# Patient Record
Sex: Male | Born: 1955 | Race: Black or African American | Hispanic: No | Marital: Single | Smoking: Current every day smoker
Health system: Southern US, Community
[De-identification: ages and names within clinical notes are randomized; demographics above are authoritative.]

## PROBLEM LIST (undated history)

## (undated) DIAGNOSIS — F329 Major depressive disorder, single episode, unspecified: Secondary | ICD-10-CM

## (undated) DIAGNOSIS — I10 Essential (primary) hypertension: Secondary | ICD-10-CM

## (undated) DIAGNOSIS — F4310 Post-traumatic stress disorder, unspecified: Secondary | ICD-10-CM

## (undated) DIAGNOSIS — R001 Bradycardia, unspecified: Secondary | ICD-10-CM

## (undated) DIAGNOSIS — G43909 Migraine, unspecified, not intractable, without status migrainosus: Secondary | ICD-10-CM

## (undated) DIAGNOSIS — M1990 Unspecified osteoarthritis, unspecified site: Secondary | ICD-10-CM

## (undated) DIAGNOSIS — F32A Depression, unspecified: Secondary | ICD-10-CM

## (undated) HISTORY — PX: TOTAL HIP ARTHROPLASTY: SHX124

## (undated) HISTORY — PX: JOINT REPLACEMENT: SHX530

---

## 2007-05-09 ENCOUNTER — Inpatient Hospital Stay (HOSPITAL_COMMUNITY): Admission: RE | Admit: 2007-05-09 | Discharge: 2007-05-13 | Payer: Self-pay | Admitting: Orthopedic Surgery

## 2008-10-28 ENCOUNTER — Other Ambulatory Visit: Payer: Self-pay | Admitting: Emergency Medicine

## 2008-10-29 ENCOUNTER — Ambulatory Visit: Payer: Self-pay | Admitting: Psychiatry

## 2008-10-29 ENCOUNTER — Inpatient Hospital Stay (HOSPITAL_COMMUNITY): Admission: AD | Admit: 2008-10-29 | Discharge: 2008-11-02 | Payer: Self-pay | Admitting: Psychiatry

## 2010-07-02 LAB — RAPID URINE DRUG SCREEN, HOSP PERFORMED
Amphetamines: NOT DETECTED
Barbiturates: NOT DETECTED
Benzodiazepines: NOT DETECTED
Cocaine: POSITIVE — AB
Opiates: NOT DETECTED

## 2010-07-02 LAB — URINALYSIS, ROUTINE W REFLEX MICROSCOPIC
Glucose, UA: NEGATIVE mg/dL
Hgb urine dipstick: NEGATIVE
Protein, ur: NEGATIVE mg/dL
Specific Gravity, Urine: 1.005 (ref 1.005–1.030)
pH: 6 (ref 5.0–8.0)

## 2010-07-02 LAB — DIFFERENTIAL
Eosinophils Relative: 1 % (ref 0–5)
Lymphocytes Relative: 17 % (ref 12–46)
Lymphs Abs: 1.5 10*3/uL (ref 0.7–4.0)
Monocytes Absolute: 0.8 10*3/uL (ref 0.1–1.0)
Neutro Abs: 6.3 10*3/uL (ref 1.7–7.7)

## 2010-07-02 LAB — COMPREHENSIVE METABOLIC PANEL
AST: 31 U/L (ref 0–37)
Albumin: 4.5 g/dL (ref 3.5–5.2)
BUN: 6 mg/dL (ref 6–23)
Calcium: 9.4 mg/dL (ref 8.4–10.5)
Creatinine, Ser: 0.94 mg/dL (ref 0.4–1.5)
GFR calc Af Amer: >60 mL/min (ref >60)

## 2010-07-02 LAB — CBC
MCHC: 32.8 g/dL (ref 30.0–36.0)
MCV: 90.3 fL (ref 78.0–100.0)
Platelets: 251 10*3/uL (ref 150–400)
RDW: 14.2 % (ref 11.5–15.5)
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
## 2010-08-09 NOTE — Op Note (Signed)
NAME:  Barry Jordan, Barry Jordan               ACCOUNT NO.:  [id number]   MEDICAL RECORD NO.:  [id number]          PATIENT TYPE:  INP   LOCATION:  0003                         FACILITY:  [facility]   PHYSICIAN:  John L. Rendall, M.D.  DATE OF BIRTH:  03-Nov-1955   DATE OF PROCEDURE:  05/09/2007  DATE OF DISCHARGE:                               OPERATIVE REPORT   PREOPERATIVE DIAGNOSIS:  End-stage osteoarthritis left hip.   PROCEDURE:  Left AML total hip replacement.   POSTOPERATIVE DIAGNOSIS:  End-stage osteoarthritis left hip.   SURGEON:  John L. Rendall, M.D.   ASSISTANT:  Legrand Pitts. Duffy, P.A.   ANESTHESIA:  General.   PATHOLOGY:  The patient has bone on bone left hip with actual  subluxation of the hip ball out of the lateral acetabulum.  There is  great deformity of the femoral head, some shortening of the leg.  The  hip ball is noted located in the center of the socket and the acetabulum  is oval in shape.   DESCRIPTION OF PROCEDURE:  Under general anesthesia, the patient was  placed in the right lateral decubitus position.  The hip was prepared  with DuraPrep and draped as a sterile field.  He received 2 grams of  Ancef preoperatively.  A posterior approach is made splitting the IT  band in the line of its fibers, inserting a short Charnley retractor.  The short external rotators and hip capsule are then released from bone  with electrocautery as the hip is internally rotated.  The hip is found  to be full of florid synovitis which is resected and multiple small  vessels are cauterized upon opening the capsule.  The hip capsule is  opened with an L-shaped incision using the electrocautery.  The hip is  then dislocated.  The superior femoral neck is exposed with a Mueller  proximally and a Catering manager.  The superior femoral neck is exposed.  An IM initiator, canal finder, and reamers were used.  Progressive  reaming up to 16 mm is done.  The femoral neck is then osteotomized and  a  cookie cutter is used to remove bone from the calcar region.  Rasping  is then done 12 mm large, 13.5 large, 15 large, and 16.5 large.  Calcar  reamer is used and excellent fit, alignment, and stability are obtained  with this sizing.  Attention is then turned to the acetabulum.  The  acetabulum has very abnormal bone around its margin with significant  spurring and synovitis.  This is debrided.  Two ring retractors are  placed superiorly and two Cobras inferiorly.  Reaming is then done with  the 43, 45, 47, 49, 51, 53, 55, 57, and 59 acetabular reamers.  Final  reaming is done up to 62 and it should be noted a very large posterior  acetabular cyst was found.  This was removed with curet and rongeurs.  It was subsequently packed with bone and the 62 reamer gave fairly good  round fit in the acetabulum.  The Pinnacle 300 series acetabulum size 64  was then inserted and it  bottomed out nicely.  It was a good choice as  there was just slight area of the cyst being soft at the bottom and one  slight area at the top, but 75-80% of all of this was well seated and  well covered.  At this point, trial reduction of the 16.5 large rasp  with 36 hip ball and a +4 acetabulum was then done.  This revealed  excellent fit, alignment, and stability.  The -2 neck length was the  best size.  At this point, permanent components were placed in the  acetabulum, an apex hole eliminator, and +4 10 degree lip 36 poly was  inserted into the acetabulum.  At this point, with the acetabulum  complete, the 16.5 AML stem was inserted and the 36 hip ball with the -2  neck length.  This, we felt, left the leg perhaps 3-4 mm long, but this  was the shortest neck length and it was felt to be the appropriate thing  to do.  At this point, the hip was reduced.  The hip capsule was  repaired with #1 Tycron.  Hemoglobin was tested and found to be 8.4.  Blood was started two units, giving him one unit in the operating room  and  the second unit was to be given upon arrival in recovery.  He was  closed in layers with #1 Tycron, 0 Vicryl, 2-0 Vicryl, and skin clips.  Operative time was 1 hour 30 minutes.   COMPLICATIONS:  Oval acetabulum and gross synovitis, but no  complications occurred in the case.  These were just difficulty factors  which were overcome.  The patient returned to the recovery room in good  condition.      John L. Rendall, M.D.  Electronically Signed     JLR/MEDQ  D:  05/09/2007  T:  05/10/2007  Job:  [id number]
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
## 2010-08-12 NOTE — Discharge Summary (Signed)
NAME:  Barry Jordan, BERTONE               ACCOUNT NO.:  [id number]   MEDICAL RECORD NO.:  [id number]          PATIENT TYPE:  INP   LOCATION:  1605                         FACILITY:  [facility]   PHYSICIAN:  John L. Rendall, M.D.  DATE OF BIRTH:  18-Dec-1955   DATE OF ADMISSION:  05/09/2007  DATE OF DISCHARGE:  05/13/2007                               DISCHARGE SUMMARY   ADMISSION DIAGNOSES:  1. End-stage osteoarthritis left hip.  2. Hypertension.  3. Tobacco abuse.  4. History of drug abuse, sober 5 years.   DISCHARGE DIAGNOSES:  1. End-stage osteoarthritis left hip status post left total hip      arthroplasty.  2. Acute blood loss anemia secondary to surgery.  3. Hypokalemia, now resolved.  4. Hematoma left hip.  5. Pyrexia.  6. Constipation, now resolved.  7. Hypertension.  8. Tobacco abuse.  9. History of drug use, sober for 5 years.   SURGICAL PROCEDURES:  On May 09, 2007, Barry Jordan underwent a left  total hip arthroplasty by Dr. Jonny Ruiz L.  Rendall assisted by Arnoldo Morale,  PA-C.  He had a pinnacle 300 series acetabular cup size 64 mm placed  with a Pinnacle Marathon acetabular liner +4 10 degree liner, 36 mm  inner diameter, 64 mm outer diameter.  An apex hole eliminator.  An AML  large stature 160 mm length, 45 mm offset, size 16.5 femoral stem with  an Articulese metal-on-metal femoral head 36 mm, -2 neck length, 12-14  taper.   COMPLICATIONS:  None.   CONSULTATIONS:  1. Case management and physical therapy consult May 10, 2007.  2. Occupational therapy consult May 11, 2007.   HISTORY OF PRESENT ILLNESS:  This 55 year old black male patient  presented to Dr. Priscille Kluver with history of a left hip pain for the last 8  years.  It came on gradually and has been getting progressively worse.  It is an intermittent sharp, stabbing sensation over the latter  trochanter and groin with radiation to the knee.  It increases with  prolonged standing and decreases with  elevation, tramadol and Vicodin.  The hip catches, grinds, locks, spasms, gives way and hurts with  sleeping on it.  He has failed conservative treatment and because of  that he is presenting for a left hip replacement.   HOSPITAL COURSE:  Barry Jordan tolerated his surgical procedure well  without immediate postoperative complications.  He was transferred to  the orthopedic floor.  On postop day #1, T-max was 101.2, vitals were  stable.  Hemoglobin was 8.1 with hematocrit of 23.1.  He was transfused  with 2 units of packed red blood cells.  Potassium was low at 3.3 and  that was supplemented orally.  He was started on therapy per protocol.   Postop day #2, T-max 101.6, vitals stable.  Hemoglobin had improved to  8.5 with hematocrit of 24.1.  He was started on iron.  Hip dressing was  intact with a moderate amount of serosanguineous drainage.  Aggressive  pulmonary toilet was done.  Temperature was monitored.  He was switched  to p.o. pain meds.  Postop day #3, hemoglobin had dropped to 7.3 with hematocrit of 21.  BP  was 117/62.  He has moderate left thigh edema it was felt he had  developed a hematoma in that thigh.  He was transfused with 2 units of  packed red blood cells and his Arixtra was felt that day.  Stools were  to be guaiaced, which were all negative.   On postop day #4, he is feeling better.  Hemoglobin was 8.9, hematocrit  25.7, vitals stable, moderate edema in the left thigh but he was doing  well with therapy.  Pain was well-controlled and it was felt he was  ready for discharge home and was discharged home later that day.   DISCHARGE INSTRUCTIONS:   DIET:  He can resume his regular prehospitalization diet.   MEDICATIONS:  He may resume his home meds as follows.  1. Amlodipine 10 mg p.o. q.a.m.  2. Hydrochlorothiazide 25 mg p.o. q.a.m.  3. He is to hold his Levitra, tramadol and ibuprofen at this time.   Additional meds at this time include  1. Arixtra 2.5 mg  subcu at 8 p.m. last dose on May 18, 2007.  On      the 22nd, he is to start a baby aspirin a day for 1 month.  2. Celebrex 200 mg 1 tablet p.o. b.i.d.  3. Lyrica 50 mg 1 tablet p.o. b.i.d. for 1 week, 14 with no refill.  4. OxyContin 10 mg 1 tablet p.o. q.12h., 24 with no refill.  5. Percocet 5/325 one to two tablets p.o. q.4h. p.r.n. for pain, 60      with no refill.  6. Robaxin 500 mg 1-2 tablets p.o. q.6h p.r.n. for spasms, 50 with no      refill.  7. He is to take iron supplement twice a day for 1 month.   ACTIVITY:  He can be out of bed weightbearing as tolerated on the left  leg with use of a walker.  No lifting or driving for 6 weeks and he  needs to increase activity slowly.  Please see the white total hip  discharge sheet for further activity instructions.   FOLLOW-UP:  He is to follow up with Dr. Priscille Kluver in our office on  Tuesday, May 21, 2007, or Thursday, May 23, 2007, and needs to  call [phone] for that appointment.   WOUND CARE:  He is to keep the incision clean and dry and may shower  after no drainage from the wound for two days.  Please see the white  total hip discharge sheet for further wound care instructions.   LABORATORY DATA:  Hemoglobin and hematocrit ranged from 12.3 and 35.6 on  the 10th, to 7.3 and 21 on the 15th, to 8.9 and 25.7 on the 16th.  White  count remained within normal limits.  Platelets went from 232 on the  10th, to 95 on the 15th, to 139 on the 16th.   Potassium ranged from 3.5 on the 10th, to 3.1 on the 12th, to 3.9 on the  14th.  CO2 ranged from 33 on the 10th, to 29 on the 14th.  Glucose  ranged from 88 on the 10th, to 139 on the 14th.  All other laboratory  studies were within normal limits.      Legrand Pitts Duffy, P.A.      John L. Rendall, M.D.  Electronically Signed    KED/MEDQ  D:  05/28/2007  T:  05/28/2007  Job:  [id number]

## 2010-12-16 LAB — CBC
HCT: 35.6 — ABNORMAL LOW
HCT: 21 — ABNORMAL LOW
Hemoglobin: 12.3 — ABNORMAL LOW
Hemoglobin: 7.3 — CL
Hemoglobin: 8.5 — ABNORMAL LOW
Hemoglobin: 8.9 — ABNORMAL LOW
MCHC: 34.8
MCHC: 35
MCV: 91.6
Platelets: 111 — ABNORMAL LOW
Platelets: 232
RBC: 2.31 — ABNORMAL LOW
RBC: 2.64 — ABNORMAL LOW
RBC: 2.91 — ABNORMAL LOW
RDW: 13.1
RDW: 13.3
RDW: 13.2
WBC: 7.4
WBC: 8.8

## 2010-12-16 LAB — COMPREHENSIVE METABOLIC PANEL
Albumin: 4.2
Alkaline Phosphatase: 97
BUN: 21
Chloride: 101
Creatinine, Ser: 0.94
GFR calc non Af Amer: >60
Glucose, Bld: 88
Potassium: 3.5
Total Bilirubin: 0.4

## 2010-12-16 LAB — URINE CULTURE: Culture: NO GROWTH

## 2010-12-16 LAB — RAPID URINE DRUG SCREEN, HOSP PERFORMED
Amphetamines: NOT DETECTED
Benzodiazepines: NOT DETECTED
Cocaine: NOT DETECTED
Opiates: NOT DETECTED
Tetrahydrocannabinol: NOT DETECTED

## 2010-12-16 LAB — CROSSMATCH: Antibody Screen: NEGATIVE

## 2010-12-16 LAB — POCT I-STAT 4, (NA,K, GLUC, HGB,HCT)
Glucose, Bld: 130 — ABNORMAL HIGH
HCT: 24 — ABNORMAL LOW
Hemoglobin: 8.2 — ABNORMAL LOW
Operator id: 242871
Sodium: 138

## 2010-12-16 LAB — BASIC METABOLIC PANEL
BUN: 10
Calcium: 8.2 — ABNORMAL LOW
Calcium: 8.4
Creatinine, Ser: 0.93
GFR calc Af Amer: >60
GFR calc non Af Amer: >60
GFR calc non Af Amer: >60
Glucose, Bld: 117 — ABNORMAL HIGH
Glucose, Bld: 139 — ABNORMAL HIGH
Sodium: 141

## 2010-12-16 LAB — URINALYSIS, ROUTINE W REFLEX MICROSCOPIC
Bilirubin Urine: NEGATIVE
Ketones, ur: NEGATIVE
Nitrite: NEGATIVE
Protein, ur: NEGATIVE
pH: 6.5

## 2010-12-16 LAB — PROTIME-INR
INR: 1
Prothrombin Time: 13.2

## 2010-12-16 LAB — DIFFERENTIAL
Basophils Absolute: 0
Basophils Relative: 1
Lymphocytes Relative: 30
Monocytes Absolute: 0.6
Neutro Abs: 4
Neutrophils Relative %: 53

## 2010-12-16 LAB — OCCULT BLOOD X 1 CARD TO LAB, STOOL: Fecal Occult Bld: NEGATIVE

## 2010-12-16 LAB — APTT: aPTT: 30

## 2010-12-16 LAB — PREPARE RBC (CROSSMATCH)

## 2012-01-30 ENCOUNTER — Emergency Department (HOSPITAL_COMMUNITY): Payer: Medicare Other

## 2012-01-30 ENCOUNTER — Encounter (HOSPITAL_COMMUNITY): Payer: Self-pay | Admitting: Emergency Medicine

## 2012-01-30 ENCOUNTER — Emergency Department (HOSPITAL_COMMUNITY)
Admission: EM | Admit: 2012-01-30 | Discharge: 2012-01-30 | Disposition: A | Discharge status: Home / Self Care | Payer: Medicare Other | Attending: Emergency Medicine | Admitting: Emergency Medicine

## 2012-01-30 DIAGNOSIS — Y9289 Other specified places as the place of occurrence of the external cause: Secondary | ICD-10-CM | POA: Insufficient documentation

## 2012-01-30 DIAGNOSIS — S93409A Sprain of unspecified ligament of unspecified ankle, initial encounter: Secondary | ICD-10-CM | POA: Insufficient documentation

## 2012-01-30 DIAGNOSIS — Y939 Activity, unspecified: Secondary | ICD-10-CM | POA: Insufficient documentation

## 2012-01-30 DIAGNOSIS — W1789XA Other fall from one level to another, initial encounter: Secondary | ICD-10-CM | POA: Insufficient documentation

## 2012-01-30 HISTORY — DX: Essential (primary) hypertension: I10

## 2012-01-30 MED ORDER — OXYCODONE-ACETAMINOPHEN 5-325 MG PO TABS: 1.0000 | ORAL_TABLET | ORAL | Status: DC | PRN

## 2012-01-30 MED ORDER — HYDROCODONE-ACETAMINOPHEN 5-325 MG PO TABS
2.0000 | ORAL_TABLET | Freq: Once | ORAL | Status: AC
  Administered 2012-01-30: 2 via ORAL
  Filled 2012-01-30: qty 2

## 2012-01-30 NOTE — ED Notes (Signed)
[id number]<YN> Expected date:<BR> Expected time:<BR> Means of arrival:Ambulance<BR> Comments:<BR> fall

## 2012-01-30 NOTE — ED Notes (Addendum)
Pt here via EMS s/p  Fall on 01/28/12 down a hill with increase pain unable to bear  Pt walks with a cane for for s/p lt hip replacement

## 2012-01-30 NOTE — ED Provider Notes (Signed)
History    76y male with left foot and ankle pain. Patient states that he fell 2 days ago while coming down a Hill. Patient ambulates with a cane at baseline thinks his cane "hit a soft spot and the ground.". Patient does admit to drinking at that time as well though. Persistent foot and ankle pain since then. Difficulty ambulating secondary to the pain. No numbness or tingling. Denies hitting his head. No headache, neck or acute back pain. Pt does says he has chronic back and b/l knee pain. Denies acute change though.   CSN: [id number]  Arrival date & time 01/30/12  1001   First MD Initiated Contact with Patient 01/30/12 1010      Chief Complaint  Patient presents with  . Ankle Pain    (Consider location/radiation/quality/duration/timing/severity/associated sxs/prior treatment) HPI  No past medical history on file.  No past surgical history on file.  No family history on file.  History  Substance Use Topics  . Smoking status: Not on file  . Smokeless tobacco: Not on file  . Alcohol Use: Not on file      Review of Systems   Review of symptoms negative unless otherwise noted in HPI.   Allergies  Review of patient's allergies indicates no known allergies.  Home Medications  No current outpatient prescriptions on file.  BP 140/102  Pulse 89  Temp 98.6 F (37 C) (Oral)  Resp 20  SpO2 99%  Physical Exam  Nursing note and vitals reviewed. Constitutional: He appears well-developed and well-nourished. No distress.  HENT:  Head: Normocephalic and atraumatic.  Eyes: Conjunctivae normal are normal. Right eye exhibits no discharge. Left eye exhibits no discharge.  Neck: Neck supple.  Cardiovascular: Normal rate, regular rhythm and normal heart sounds.  Exam reveals no gallop and no friction rub.   No murmur heard. Pulmonary/Chest: Effort normal and breath sounds normal. No respiratory distress.  Abdominal: Soft. He exhibits no distension. There is no tenderness.    Musculoskeletal: He exhibits no edema and no tenderness.       Diffuse swelling of L foot and ankle. No ecchymosis or concerning skin lesions noted. Tenderness over medial malleolus and distal foot. NVI distally. Both knee in braces. Removed. Normal to inspection. No effusion. No bony tenderness. No midline spinal tenderness.  Neurological: He is alert.  Skin: Skin is warm and dry.  Psychiatric: He has a normal mood and affect. His behavior is normal. Thought content normal.       Not clinically intoxicated.    ED Course  Procedures (including critical care time)  Labs Reviewed - No data to display Dg Ankle Complete Left  01/30/2012  *RADIOLOGY REPORT*  Clinical Data:  ankle pain  LEFT ANKLE COMPLETE - 3+ VIEW  Comparison: None.  Findings: 3  views of the left ankle submitted.  No acute fracture or subluxation.  Ankle mortise is preserved.  Plantar spur of the calcaneus.  IMPRESSION: No acute fracture or subluxation.  Plantar spur of the calcaneus.   Original Report Authenticated By: Natasha Mead, M.D.    Dg Foot Complete Left  01/30/2012  *RADIOLOGY REPORT*  Clinical Data: Fall 4 days ago, pain  LEFT FOOT - COMPLETE 3+ VIEW  Comparison: None  Findings: Osseous demineralization diffusely, moderate. Joint spaces preserved. Minimal hallux valgus. Diffuse soft tissue swelling. No acute fracture, dislocation or bone destruction. Small plantar calcaneal spur.  IMPRESSION: Moderate osseous demineralization. Diffuse soft tissue swelling. No acute bony abnormalities.   Original Report Authenticated By:  Ulyses Southward, M.D.      1. Ankle sprain       MDM  45M with L foot/ankle pain after mechanical fall. Imaging neg for acute osseous injury. NV intact. Plan splint and crutches. RICE. PRN pain meds.        Raeford Razor, MD 01/30/12 [phone]

## 2013-09-22 ENCOUNTER — Emergency Department (HOSPITAL_COMMUNITY)
Admission: EM | Admit: 2013-09-22 | Discharge: 2013-09-23 | Disposition: A | Discharge status: Home / Self Care | Payer: Medicare Other | Attending: Emergency Medicine | Admitting: Emergency Medicine

## 2013-09-22 ENCOUNTER — Encounter (HOSPITAL_COMMUNITY): Payer: Self-pay | Admitting: Emergency Medicine

## 2013-09-22 DIAGNOSIS — F3289 Other specified depressive episodes: Secondary | ICD-10-CM | POA: Insufficient documentation

## 2013-09-22 DIAGNOSIS — F1910 Other psychoactive substance abuse, uncomplicated: Secondary | ICD-10-CM

## 2013-09-22 DIAGNOSIS — F411 Generalized anxiety disorder: Secondary | ICD-10-CM | POA: Insufficient documentation

## 2013-09-22 DIAGNOSIS — R45851 Suicidal ideations: Secondary | ICD-10-CM | POA: Insufficient documentation

## 2013-09-22 DIAGNOSIS — Z87891 Personal history of nicotine dependence: Secondary | ICD-10-CM | POA: Insufficient documentation

## 2013-09-22 DIAGNOSIS — F1010 Alcohol abuse, uncomplicated: Secondary | ICD-10-CM | POA: Diagnosis present

## 2013-09-22 DIAGNOSIS — F1410 Cocaine abuse, uncomplicated: Secondary | ICD-10-CM | POA: Diagnosis present

## 2013-09-22 DIAGNOSIS — I10 Essential (primary) hypertension: Secondary | ICD-10-CM | POA: Insufficient documentation

## 2013-09-22 DIAGNOSIS — G479 Sleep disorder, unspecified: Secondary | ICD-10-CM | POA: Insufficient documentation

## 2013-09-22 DIAGNOSIS — F32A Depression, unspecified: Secondary | ICD-10-CM

## 2013-09-22 DIAGNOSIS — F329 Major depressive disorder, single episode, unspecified: Secondary | ICD-10-CM | POA: Diagnosis present

## 2013-09-22 HISTORY — DX: Major depressive disorder, single episode, unspecified: F32.9

## 2013-09-22 HISTORY — DX: Depression, unspecified: F32.A

## 2013-09-22 HISTORY — DX: Post-traumatic stress disorder, unspecified: F43.10

## 2013-09-22 LAB — CBC
HCT: 42.7 % (ref 39.0–52.0)
HEMOGLOBIN: 14.3 g/dL (ref 13.0–17.0)
MCH: 30.2 pg (ref 26.0–34.0)
MCHC: 33.5 g/dL (ref 30.0–36.0)
MCV: 90.3 fL (ref 78.0–100.0)
Platelets: 251 10*3/uL (ref 150–400)
RBC: 4.73 MIL/uL (ref 4.22–5.81)
RDW: 13.4 % (ref 11.5–15.5)
WBC: 7.4 10*3/uL (ref 4.0–10.5)

## 2013-09-22 LAB — RAPID URINE DRUG SCREEN, HOSP PERFORMED
Amphetamines: NOT DETECTED
Barbiturates: NOT DETECTED
Benzodiazepines: NOT DETECTED
COCAINE: POSITIVE — AB
OPIATES: NOT DETECTED
Tetrahydrocannabinol: NOT DETECTED

## 2013-09-22 LAB — COMPREHENSIVE METABOLIC PANEL
ALT: 23 U/L (ref 0–53)
AST: 30 U/L (ref 0–37)
Albumin: 4.2 g/dL (ref 3.5–5.2)
Alkaline Phosphatase: 90 U/L (ref 39–117)
BUN: 9 mg/dL (ref 6–23)
CALCIUM: 9.5 mg/dL (ref 8.4–10.5)
CO2: 25 meq/L (ref 19–32)
CREATININE: 0.96 mg/dL (ref 0.50–1.35)
Chloride: 101 mEq/L (ref 96–112)
GLUCOSE: 86 mg/dL (ref 70–99)
Potassium: 3.7 mEq/L (ref 3.7–5.3)
SODIUM: 139 meq/L (ref 137–147)
TOTAL PROTEIN: 7.8 g/dL (ref 6.0–8.3)
Total Bilirubin: 0.5 mg/dL (ref 0.3–1.2)

## 2013-09-22 LAB — ACETAMINOPHEN LEVEL: Acetaminophen (Tylenol), Serum: <15 ug/mL (ref 10–30)

## 2013-09-22 LAB — TROPONIN I: Troponin I: <0.3 ng/mL (ref <0.30)

## 2013-09-22 LAB — SALICYLATE LEVEL

## 2013-09-22 LAB — ETHANOL: ALCOHOL ETHYL (B): 162 mg/dL — AB (ref 0–11)

## 2013-09-22 MED ORDER — FOLIC ACID 1 MG PO TABS
1.0000 mg | ORAL_TABLET | Freq: Every day | ORAL | Status: DC
  Administered 2013-09-22 – 2013-09-23 (×2): 1 mg via ORAL
  Filled 2013-09-22 (×2): qty 1

## 2013-09-22 MED ORDER — CHLORDIAZEPOXIDE HCL 25 MG PO CAPS: 25.0000 mg | ORAL_CAPSULE | Freq: Four times a day (QID) | ORAL | Status: DC | PRN

## 2013-09-22 MED ORDER — LORAZEPAM 1 MG PO TABS: 1.0000 mg | ORAL_TABLET | Freq: Four times a day (QID) | ORAL | Status: DC | PRN

## 2013-09-22 MED ORDER — LORAZEPAM 2 MG/ML IJ SOLN
1.0000 mg | Freq: Four times a day (QID) | INTRAMUSCULAR | Status: DC | PRN
Start: 2013-09-22 — End: 2013-09-23

## 2013-09-22 MED ORDER — VITAMIN B-1 100 MG PO TABS
100.0000 mg | ORAL_TABLET | Freq: Every day | ORAL | Status: DC
  Administered 2013-09-22 – 2013-09-23 (×2): 100 mg via ORAL
  Filled 2013-09-22 (×2): qty 1

## 2013-09-22 MED ORDER — LORAZEPAM 1 MG PO TABS: 0.0000 mg | ORAL_TABLET | Freq: Two times a day (BID) | ORAL | Status: DC

## 2013-09-22 MED ORDER — ADULT MULTIVITAMIN W/MINERALS CH
1.0000 | ORAL_TABLET | Freq: Every day | ORAL | Status: DC
  Administered 2013-09-22 – 2013-09-23 (×2): 1 via ORAL
  Filled 2013-09-22 (×2): qty 1

## 2013-09-22 MED ORDER — THIAMINE HCL 100 MG/ML IJ SOLN: 100.0000 mg | Freq: Every day | INTRAMUSCULAR | Status: DC

## 2013-09-22 MED ORDER — LORAZEPAM 1 MG PO TABS: 0.0000 mg | ORAL_TABLET | Freq: Four times a day (QID) | ORAL | Status: DC

## 2013-09-22 NOTE — ED Notes (Signed)
Psychiatry at bedside.

## 2013-09-22 NOTE — ED Notes (Addendum)
Patient has one bag of belongings and a walker. These items are in locker 29.

## 2013-09-22 NOTE — ED Notes (Signed)
Pt belongings in locker #29. 

## 2013-09-22 NOTE — ED Notes (Signed)
Pt states that he uses his walker when his hip "stiffens up" on occasion. Unable to move to psych ed because of this.

## 2013-09-22 NOTE — ED Notes (Signed)
ACT at bedside 

## 2013-09-22 NOTE — ED Notes (Signed)
Bed: [facility] Expected date:  Expected time:  Means of arrival:  Comments: [place]

## 2013-09-22 NOTE — ED Notes (Signed)
Per previous nurse and [employer] pt walking unassisted with no problems in room and in to facility. No walker needed.

## 2013-09-22 NOTE — Progress Notes (Signed)
  CARE MANAGEMENT ED NOTE 09/22/2013  Patient:  Budd PalmerOTTS,Tremar L   Account Number:  [id number]  Date Initiated:  09/22/2013  Documentation initiated by:  Radford PaxFERRERO,AMY  Subjective/Objective Assessment:   Patient presents to ED after [facility].     Subjective/Objective Assessment Detail:     Action/Plan:   Action/Plan Detail:   Anticipated DC Date:       Status Recommendation to Physician:   Result of Recommendation:    Other ED Services  Consult Working Plan    DC Planning Services  Other  PCP issues    Choice offered to / List presented to:            Status of service:  Completed, signed off  ED Comments:   ED Comments Detail:  EDCM spoke with patient at bedisde.  Patient confirms [facility] [place] [address] [place].  System updated.
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
## 2013-09-22 NOTE — Consult Note (Signed)
Barry Jordan Face-to-Face Psychiatry Consult   Reason for Consult:  Depression  Referring Physician:  EDP  Barry Jordan is an 58 y.o. male. Total Time spent with patient: 20 minutes  Assessment: AXIS I:  Alcohol Abuse and Substance Abuse, depression AXIS II:  Deferred AXIS III:   Past Medical History  Diagnosis Date  . Hypertension   . PTSD (post-traumatic stress disorder)   . Depression    AXIS IV:  other psychosocial or environmental problems, problems related to social environment and problems with primary support group AXIS V:  41-50 serious symptoms  Plan:  Supportive therapy provided about ongoing stressors. Re-assess in am, awaiting collateral information from his sister who he allowed Barry Jordan to contact at [phone]  Subjective:   Barry Jordan is a 58 y.o. male patient to stay over night for observation and re-assess in am.  HPI:  Patient states he was frustrated with the [place] because his medication ran out on Thursday and he needed to get more.  He had used cocaine the night before and drank a 40 oz beer this morning.  He got upset but denies he said he was going to shoot himself or anyone else.  Pharell states the police arrived at this house and he agreed to come to the ED, not IVC'd.  Denies suicidal/homicidal ideations and hallucinations.  Drinks 2 - 40 oz beers daily and occasional marijuana use.  He does not want detox as he feels his drinking or cocaine use is a problem.  He has been to detox in the past, last one was in 2010.  Denies suicide attempts or any psychiatric admissions.  When I spoke to his primary RN, she said his sisters came to the ED and feel differently about his present mood state.  They reported he will say all the right things to discharge but really needs help.  Tavaris gave permission to call his sister, Mayzell at [phone] but she was not home, awaiting a returned call. HPI Elements:   Location:  generalized. Quality:  acute. Severity:   moderate. Timing:  intermittent. Duration:  few weeks. Context:  stressors.  Past Psychiatric History: Past Medical History  Diagnosis Date  . Hypertension   . PTSD (post-traumatic stress disorder)   . Depression     reports that he has quit smoking. He does not have any smokeless tobacco history on file. He reports that he drinks about 3.6 ounces of alcohol per week. He reports that he uses illicit drugs (Cocaine). History reviewed. No pertinent family history.         Allergies:  No Known Allergies  ACT Assessment Complete:  Yes:    Educational Status    Risk to Self: Risk to self Is patient at risk for suicide?: No  Risk to Others:    Abuse:    Prior Inpatient Therapy:    Prior Outpatient Therapy:    Additional Information:                    Objective: Blood pressure 128/94, pulse 79, temperature 98.2 F (36.8 C), temperature source Oral, resp. rate 16, SpO2 98.00%.There is no height or weight on file to calculate BMI. Results for orders placed during the Jordan encounter of 09/22/13 (from the past 72 hour(s))  URINE RAPID DRUG SCREEN (HOSP PERFORMED)     Status: Abnormal   Collection Time    09/22/13 11:47 AM      Result Value Ref Range   Opiates NONE DETECTED  NONE DETECTED   Cocaine POSITIVE (*) NONE DETECTED   Benzodiazepines NONE DETECTED  NONE DETECTED   Amphetamines NONE DETECTED  NONE DETECTED   Tetrahydrocannabinol NONE DETECTED  NONE DETECTED   Barbiturates NONE DETECTED  NONE DETECTED   Comment:            DRUG SCREEN FOR MEDICAL PURPOSES     ONLY.  IF CONFIRMATION IS NEEDED     FOR ANY PURPOSE, NOTIFY LAB     WITHIN 5 DAYS.                LOWEST DETECTABLE LIMITS     FOR URINE DRUG SCREEN     Drug Class       Cutoff (ng/mL)     Amphetamine      1000     Barbiturate      200     Benzodiazepine   132     Tricyclics       440     Opiates          300     Cocaine          300     THC              50  ACETAMINOPHEN LEVEL      Status: None   Collection Time    09/22/13 12:00 PM      Result Value Ref Range   Acetaminophen (Tylenol), Serum <15.0  10 - 30 ug/mL   Comment:            THERAPEUTIC CONCENTRATIONS VARY     SIGNIFICANTLY. A RANGE OF 10-30     ug/mL MAY BE AN EFFECTIVE     CONCENTRATION FOR MANY PATIENTS.     HOWEVER, SOME ARE BEST TREATED     AT CONCENTRATIONS OUTSIDE THIS     RANGE.     ACETAMINOPHEN CONCENTRATIONS     >150 ug/mL AT 4 HOURS AFTER     INGESTION AND >50 ug/mL AT 12     HOURS AFTER INGESTION ARE     OFTEN ASSOCIATED WITH TOXIC     REACTIONS.  CBC     Status: None   Collection Time    09/22/13 12:00 PM      Result Value Ref Range   WBC 7.4  4.0 - 10.5 K/uL   RBC 4.73  4.22 - 5.81 MIL/uL   Hemoglobin 14.3  13.0 - 17.0 g/dL   HCT 42.7  39.0 - 52.0 %   MCV 90.3  78.0 - 100.0 fL   MCH 30.2  26.0 - 34.0 pg   MCHC 33.5  30.0 - 36.0 g/dL   RDW 13.4  11.5 - 15.5 %   Platelets 251  150 - 400 K/uL  COMPREHENSIVE METABOLIC PANEL     Status: None   Collection Time    09/22/13 12:00 PM      Result Value Ref Range   Sodium 139  137 - 147 mEq/L   Potassium 3.7  3.7 - 5.3 mEq/L   Chloride 101  96 - 112 mEq/L   CO2 25  19 - 32 mEq/L   Glucose, Bld 86  70 - 99 mg/dL   BUN 9  6 - 23 mg/dL   Creatinine, Ser 0.96  0.50 - 1.35 mg/dL   Calcium 9.5  8.4 - 10.5 mg/dL   Total Protein 7.8  6.0 - 8.3 g/dL   Albumin 4.2  3.5 - 5.2 g/dL  AST 30  0 - 37 U/L   ALT 23  0 - 53 U/L   Alkaline Phosphatase 90  39 - 117 U/L   Total Bilirubin 0.5  0.3 - 1.2 mg/dL   GFR calc non Af Amer >90  >90 mL/min   GFR calc Af Amer >90  >90 mL/min   Comment: (NOTE)     The eGFR has been calculated using the CKD EPI equation.     This calculation has not been validated in all clinical situations.     eGFR's persistently <90 mL/min signify possible Chronic Kidney     Disease.  ETHANOL     Status: Abnormal   Collection Time    09/22/13 12:00 PM      Result Value Ref Range   Alcohol, Ethyl (B) 162 (*) 0 - 11  mg/dL   Comment:            LOWEST DETECTABLE LIMIT FOR     SERUM ALCOHOL IS 11 mg/dL     FOR MEDICAL PURPOSES ONLY  SALICYLATE LEVEL     Status: Abnormal   Collection Time    09/22/13 12:00 PM      Result Value Ref Range   Salicylate Lvl <8.3 (*) 2.8 - 20.0 mg/dL  TROPONIN I     Status: None   Collection Time    09/22/13  4:49 PM      Result Value Ref Range   Troponin I <0.30  <0.30 ng/mL   Comment:            Due to the release kinetics of cTnI,     a negative result within the first hours     of the onset of symptoms does not rule out     myocardial infarction with certainty.     If myocardial infarction is still suspected,     repeat the test at appropriate intervals.   Labs are reviewed and are pertinent for no medical issues noted.  Current Facility-Administered Medications  Medication Dose Route Frequency Provider Last Rate Last Dose  . chlordiazePOXIDE (LIBRIUM) capsule 25 mg  25 mg Oral QID PRN Waylan Boga, NP       Current Outpatient Prescriptions  Medication Sig Dispense Refill  . HYDROcodone-acetaminophen (NORCO/VICODIN) 5-325 MG per tablet Take 1 tablet by mouth every 6 (six) hours as needed for moderate pain (hip replacements; knee replacement.Marland Kitchen).      Marland Kitchen ibuprofen (ADVIL,MOTRIN) 200 MG tablet Take 600 mg by mouth every 6 (six) hours as needed. For pain        Psychiatric Specialty Exam:     Blood pressure 128/94, pulse 79, temperature 98.2 F (36.8 C), temperature source Oral, resp. rate 16, SpO2 98.00%.There is no height or weight on file to calculate BMI.  General Appearance: Casual  Eye Contact::  Good  Speech:  Normal Rate  Volume:  Normal  Mood:  Depressed  Affect:  Congruent  Thought Process:  Coherent  Orientation:  Full (Time, Place, and Person)  Thought Content:  WDL  Suicidal Thoughts:  No  Homicidal Thoughts:  No  Memory:  Immediate;   Fair Recent;   Fair Remote;   Fair  Judgement:  Fair  Insight:  Fair  Psychomotor Activity:   Decreased  Concentration:  Fair  Recall:  [employer] of Knowledge:Fair  Language: Fair  Akathisia:  No  Handed:  Right  AIMS (if indicated):     Assets:  Catering manager Housing Leisure Time Resilience Social  Support Transportation  Sleep:      Musculoskeletal: Strength & Muscle Tone: within normal limits Gait & Station: normal Patient leans: N/A  Treatment Plan Summary: Daily contact with patient to assess and evaluate symptoms and progress in treatment Medication management; observe over night and re-evaluate in the morning after contacting his family for safety concerns.  Waylan Boga, [place] 09/22/2013 6:08 PM  Reviewed the information documented and agree with the treatment plan.  JONNALAGADDA,JANARDHAHA R. 09/23/2013 1:07 PM
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
## 2013-09-23 MED ORDER — FLUOXETINE HCL 20 MG PO CAPS: 20.0000 mg | ORAL_CAPSULE | Freq: Every day | ORAL | Status: AC

## 2013-09-23 MED ORDER — FLUOXETINE HCL 20 MG PO CAPS
20.0000 mg | ORAL_CAPSULE | Freq: Every day | ORAL | Status: DC
  Administered 2013-09-23: 20 mg via ORAL
  Filled 2013-09-23: qty 1

## 2013-09-23 NOTE — [facility] Suicide Risk Assessment (Signed)
Suicide Risk Assessment  Discharge Assessment     Demographic Factors:  Male and Access to firearms  Total Time spent with patient: 30 minutes  Psychiatric Specialty Exam:     Blood pressure 130/88, pulse 58, temperature 97.8 F (36.6 C), temperature source Oral, resp. rate 16, SpO2 95.00%.There is no height or weight on file to calculate BMI.  General Appearance: Well Groomed  Patent attorneyye Contact::  Good  Speech:  Clear and Coherent  Volume:  Normal  Mood:  Euthymic  Affect:  Appropriate  Thought Process:  Coherent and Logical  Orientation:  Full (Time, Place, and Person)  Thought Content:  Negative  Suicidal Thoughts:  No  Homicidal Thoughts:  No  Memory:  Immediate;   Good Recent;   Good Remote;   Good  Judgement:  Good  Insight:  Good  Psychomotor Activity:  Normal  Concentration:  Good  Recall:  Good  Fund of Knowledge:Good  Language: Good  Akathisia:  Negative  Handed:  Right  AIMS (if indicated):     Assets:  ArchitectCommunication Skills Financial Resources/Insurance Housing Leisure Time Social Support Transportation  Sleep:       Musculoskeletal: Strength & Muscle Tone: within normal limits Gait & Station: normal Patient leans: N/A   Mental Status Per Nursing Assessment::   On Admission:     Current Mental Status by Physician: NA  Loss Factors: NA  Historical Factors: NA  Risk Reduction Factors:   NA  Continued Clinical Symptoms:  none  Cognitive Features That Contribute To Risk:  none    Suicide Risk:  Minimal: No identifiable suicidal ideation.  Patients presenting with no risk factors but with morbid ruminations; may be classified as minimal risk based on the severity of the depressive symptoms  Discharge Diagnoses:   AXIS I:  Depressive Disorder NOS AXIS II:  Deferred AXIS III:   Past Medical History  Diagnosis Date  . Hypertension   . PTSD (post-traumatic stress disorder)   . Depression    AXIS IV:  situational around not having his  medication AXIS V:  61-70 mild symptoms  Plan Of Care/Follow-up recommendations:  Activity:  resume usual activity Diet:  resume usual diet  Is patient on multiple antipsychotic therapies at discharge:  No   Has Patient had three or more failed trials of antipsychotic monotherapy by history:  No  Recommended Plan for Multiple Antipsychotic Therapies: NA    TAYLOR,GERALD D 09/23/2013, 11:41 AM

## 2013-09-23 NOTE — Consult Note (Signed)
  Psychiatric Specialty Exam: Physical Exam  ROS  Blood pressure 130/88, pulse 58, temperature 97.8 F (36.6 C), temperature source Oral, resp. rate 16, SpO2 95.00%.There is no height or weight on file to calculate BMI.  General Appearance: Fairly Groomed  Patent attorneyye Contact::  Good  Speech:  Clear and Coherent  Volume:  Normal  Mood:  Euthymic  Affect:  Appropriate  Thought Process:  Coherent and Logical  Orientation:  Full (Time, Place, and Person)  Thought Content:  Negative  Suicidal Thoughts:  No  Homicidal Thoughts:  No  Memory:  Immediate;   Good Recent;   Good Remote;   Good  Judgement:  Good  Insight:  Fair  Psychomotor Activity:  Normal  Concentration:  Good  Recall:  Good  Akathisia:  Negative  Handed:  Right  AIMS (if indicated):     Assets:  ArchitectCommunication Skills Financial Resources/Insurance Housing Leisure Time Social Support Transportation  Sleep:   good  Barry Jordan says he is not suicidal or homicidal and there is no reason to be here.  Admits to drinking and using cocaine sometimes.  He ran out of his medication and [facility].  [facility]

## 2013-09-23 NOTE — Consult Note (Signed)
Review of Systems   Constitutional: Negative.    HENT: Negative.    Eyes: Negative.    Respiratory: Negative.    Cardiovascular: Negative.    Gastrointestinal: Negative.    Genitourinary: Negative.    Musculoskeletal: Negative.    Skin: Negative.    Neurological: Negative.    Endo/Heme/Allergies: Negative.    Psychiatric/Behavioral: Negative.

## 2013-09-23 NOTE — ED Notes (Signed)
Bedside report received from previous RN, Abbie. 

## 2013-09-23 NOTE — ED Notes (Signed)
Psych MD and NP at bedside. 

## 2016-06-24 ENCOUNTER — Inpatient Hospital Stay (HOSPITAL_COMMUNITY)
Admission: EM | Admit: 2016-06-24 | Discharge: 2016-06-26 | DRG: 605 | Discharge status: Against Medical Advice | Payer: Medicare Other | Attending: Emergency Medicine | Admitting: Emergency Medicine

## 2016-06-24 ENCOUNTER — Observation Stay (HOSPITAL_COMMUNITY): Payer: Medicare Other

## 2016-06-24 ENCOUNTER — Emergency Department (HOSPITAL_COMMUNITY): Payer: Medicare Other

## 2016-06-24 ENCOUNTER — Encounter (HOSPITAL_COMMUNITY): Payer: Self-pay | Admitting: Emergency Medicine

## 2016-06-24 DIAGNOSIS — S71032A Puncture wound without foreign body, left hip, initial encounter: Secondary | ICD-10-CM | POA: Diagnosis present

## 2016-06-24 DIAGNOSIS — M9702XA Periprosthetic fracture around internal prosthetic left hip joint, initial encounter: Secondary | ICD-10-CM | POA: Diagnosis present

## 2016-06-24 DIAGNOSIS — S41132A Puncture wound without foreign body of left upper arm, initial encounter: Secondary | ICD-10-CM | POA: Diagnosis present

## 2016-06-24 DIAGNOSIS — T84019A Broken internal joint prosthesis, unspecified site, initial encounter: Secondary | ICD-10-CM

## 2016-06-24 DIAGNOSIS — F17200 Nicotine dependence, unspecified, uncomplicated: Secondary | ICD-10-CM | POA: Diagnosis present

## 2016-06-24 DIAGNOSIS — S71132A Puncture wound without foreign body, left thigh, initial encounter: Secondary | ICD-10-CM | POA: Diagnosis present

## 2016-06-24 DIAGNOSIS — Z885 Allergy status to narcotic agent status: Secondary | ICD-10-CM | POA: Diagnosis not present

## 2016-06-24 DIAGNOSIS — Z96643 Presence of artificial hip joint, bilateral: Secondary | ICD-10-CM | POA: Diagnosis present

## 2016-06-24 DIAGNOSIS — Z5329 Procedure and treatment not carried out because of patient's decision for other reasons: Secondary | ICD-10-CM | POA: Diagnosis not present

## 2016-06-24 DIAGNOSIS — Z96649 Presence of unspecified artificial hip joint: Secondary | ICD-10-CM

## 2016-06-24 DIAGNOSIS — F1490 Cocaine use, unspecified, uncomplicated: Secondary | ICD-10-CM | POA: Diagnosis present

## 2016-06-24 DIAGNOSIS — Z23 Encounter for immunization: Secondary | ICD-10-CM | POA: Diagnosis present

## 2016-06-24 DIAGNOSIS — W3400XA Accidental discharge from unspecified firearms or gun, initial encounter: Secondary | ICD-10-CM

## 2016-06-24 DIAGNOSIS — Z5321 Procedure and treatment not carried out due to patient leaving prior to being seen by health care provider: Secondary | ICD-10-CM | POA: Diagnosis not present

## 2016-06-24 HISTORY — DX: Migraine, unspecified, not intractable, without status migrainosus: G43.909

## 2016-06-24 HISTORY — DX: Bradycardia, unspecified: R00.1

## 2016-06-24 HISTORY — DX: Unspecified osteoarthritis, unspecified site: M19.90

## 2016-06-24 LAB — CBC
HCT: 39.6 % (ref 39.0–52.0)
HEMATOCRIT: 33.2 % — AB (ref 39.0–52.0)
HEMOGLOBIN: 13.2 g/dL (ref 13.0–17.0)
Hemoglobin: 10.8 g/dL — ABNORMAL LOW (ref 13.0–17.0)
MCH: 30.2 pg (ref 26.0–34.0)
MCH: 30.2 pg (ref 26.0–34.0)
MCHC: 33.3 g/dL (ref 30.0–36.0)
MCHC: 32.5 g/dL (ref 30.0–36.0)
MCV: 90.6 fL (ref 78.0–100.0)
MCV: 92.7 fL (ref 78.0–100.0)
Platelets: 251 10*3/uL (ref 150–400)
Platelets: 177 10*3/uL (ref 150–400)
RBC: 4.37 MIL/uL (ref 4.22–5.81)
RBC: 3.58 MIL/uL — ABNORMAL LOW (ref 4.22–5.81)
RDW: 14.5 % (ref 11.5–15.5)
RDW: 14.6 % (ref 11.5–15.5)
WBC: 6.9 10*3/uL (ref 4.0–10.5)
WBC: 12.2 10*3/uL — ABNORMAL HIGH (ref 4.0–10.5)

## 2016-06-24 LAB — I-STAT CHEM 8, ED
BUN: 13 mg/dL (ref 6–20)
CALCIUM ION: 1.03 mmol/L — AB (ref 1.15–1.40)
CHLORIDE: 106 mmol/L (ref 101–111)
Creatinine, Ser: 1 mg/dL (ref 0.61–1.24)
GLUCOSE: 78 mg/dL (ref 65–99)
HCT: 40 % (ref 39.0–52.0)
HEMOGLOBIN: 13.6 g/dL (ref 13.0–17.0)
Potassium: 3.7 mmol/L (ref 3.5–5.1)
Sodium: 140 mmol/L (ref 135–145)
TCO2: 20 mmol/L (ref 0–100)

## 2016-06-24 LAB — I-STAT CG4 LACTIC ACID, ED: LACTIC ACID, VENOUS: 3.25 mmol/L — AB (ref 0.5–1.9)

## 2016-06-24 LAB — BPAM RBC
BLOOD PRODUCT EXPIRATION DATE: 201804182359
Blood Product Expiration Date: 201804212359
ISSUE DATE / TIME: 201803310508
ISSUE DATE / TIME: 201803310508
Unit Type and Rh: 9500
Unit Type and Rh: 9500

## 2016-06-24 LAB — TYPE AND SCREEN
ABO/RH(D): O POS
Antibody Screen: NEGATIVE
UNIT DIVISION: 0
UNIT DIVISION: 0

## 2016-06-24 LAB — PREPARE FRESH FROZEN PLASMA
Unit division: 0
Unit division: 0

## 2016-06-24 LAB — BPAM FFP
Blood Product Expiration Date: 201804022359
Blood Product Expiration Date: 201804022359
ISSUE DATE / TIME: 201803310509
ISSUE DATE / TIME: 201803310509
UNIT TYPE AND RH: 6200
Unit Type and Rh: 6200

## 2016-06-24 LAB — ABO/RH: ABO/RH(D): O POS

## 2016-06-24 LAB — MRSA PCR SCREENING: MRSA by PCR: NEGATIVE

## 2016-06-24 LAB — COMPREHENSIVE METABOLIC PANEL
ALK PHOS: 86 U/L (ref 38–126)
ALT: 18 U/L (ref 17–63)
ANION GAP: 16 — AB (ref 5–15)
AST: 31 U/L (ref 15–41)
Albumin: 4.1 g/dL (ref 3.5–5.0)
BUN: 13 mg/dL (ref 6–20)
CALCIUM: 8.9 mg/dL (ref 8.9–10.3)
CO2: 18 mmol/L — AB (ref 22–32)
Chloride: 104 mmol/L (ref 101–111)
Creatinine, Ser: 0.91 mg/dL (ref 0.61–1.24)
Glucose, Bld: 77 mg/dL (ref 65–99)
Potassium: 3.7 mmol/L (ref 3.5–5.1)
SODIUM: 138 mmol/L (ref 135–145)
Total Bilirubin: 0.8 mg/dL (ref 0.3–1.2)
Total Protein: 6.9 g/dL (ref 6.5–8.1)

## 2016-06-24 LAB — HIV ANTIBODY (ROUTINE TESTING W REFLEX): HIV SCREEN 4TH GENERATION: NONREACTIVE

## 2016-06-24 LAB — CDS SEROLOGY

## 2016-06-24 LAB — PROTIME-INR
INR: 1.09
PROTHROMBIN TIME: 14.2 s (ref 11.4–15.2)

## 2016-06-24 LAB — BLOOD PRODUCT ORDER (VERBAL) VERIFICATION

## 2016-06-24 LAB — ETHANOL: Alcohol, Ethyl (B): 118 mg/dL — ABNORMAL HIGH (ref <5)

## 2016-06-24 MED ORDER — TETANUS-DIPHTH-ACELL PERTUSSIS 5-2.5-18.5 LF-MCG/0.5 IM SUSP
0.5000 mL | Freq: Once | INTRAMUSCULAR | Status: AC
  Administered 2016-06-24: 0.5 mL via INTRAMUSCULAR

## 2016-06-24 MED ORDER — DOCUSATE SODIUM 100 MG PO CAPS
100.0000 mg | ORAL_CAPSULE | Freq: Two times a day (BID) | ORAL | Status: DC
  Administered 2016-06-24 – 2016-06-26 (×5): 100 mg via ORAL
  Filled 2016-06-24 (×5): qty 1

## 2016-06-24 MED ORDER — BACITRACIN ZINC 500 UNIT/GM EX OINT
TOPICAL_OINTMENT | Freq: Two times a day (BID) | CUTANEOUS | Status: DC
  Administered 2016-06-24 (×2): via TOPICAL
  Administered 2016-06-25: 1 via TOPICAL
  Administered 2016-06-25 – 2016-06-26 (×2): via TOPICAL
  Filled 2016-06-24: qty 28.35

## 2016-06-24 MED ORDER — TETANUS-DIPHTH-ACELL PERTUSSIS 5-2.5-18.5 LF-MCG/0.5 IM SUSP
INTRAMUSCULAR | Status: AC
  Filled 2016-06-24: qty 0.5

## 2016-06-24 MED ORDER — SODIUM CHLORIDE 0.9 % IV SOLN: Freq: Once | INTRAVENOUS | Status: DC

## 2016-06-24 MED ORDER — DOXYCYCLINE HYCLATE 100 MG PO TABS
100.0000 mg | ORAL_TABLET | Freq: Two times a day (BID) | ORAL | Status: DC
  Administered 2016-06-24 – 2016-06-26 (×5): 100 mg via ORAL
  Filled 2016-06-24 (×5): qty 1

## 2016-06-24 MED ORDER — ENOXAPARIN SODIUM 40 MG/0.4ML ~~LOC~~ SOLN
40.0000 mg | SUBCUTANEOUS | Status: DC
  Administered 2016-06-24 – 2016-06-25 (×2): 40 mg via SUBCUTANEOUS
  Filled 2016-06-24 (×2): qty 0.4

## 2016-06-24 MED ORDER — FENTANYL CITRATE (PF) 100 MCG/2ML IJ SOLN
INTRAMUSCULAR | Status: AC
  Filled 2016-06-24: qty 2

## 2016-06-24 MED ORDER — ACETAMINOPHEN 325 MG PO TABS
650.0000 mg | ORAL_TABLET | ORAL | Status: DC | PRN
  Administered 2016-06-24: 650 mg via ORAL
  Filled 2016-06-24: qty 2

## 2016-06-24 MED ORDER — SODIUM CHLORIDE 0.9 % IV SOLN
INTRAVENOUS | Status: DC
  Administered 2016-06-25 (×3): via INTRAVENOUS

## 2016-06-24 MED ORDER — ONDANSETRON HCL 4 MG/2ML IJ SOLN: 4.0000 mg | Freq: Four times a day (QID) | INTRAMUSCULAR | Status: DC | PRN

## 2016-06-24 MED ORDER — HYDROMORPHONE HCL 1 MG/ML IJ SOLN
INTRAMUSCULAR | Status: AC
  Administered 2016-06-24: 0.5 mg via INTRAVENOUS
  Filled 2016-06-24: qty 1

## 2016-06-24 MED ORDER — OXYCODONE HCL 5 MG PO TABS
5.0000 mg | ORAL_TABLET | ORAL | Status: DC | PRN
  Administered 2016-06-24 – 2016-06-26 (×7): 5 mg via ORAL
  Filled 2016-06-24 (×7): qty 1

## 2016-06-24 MED ORDER — HYDROMORPHONE HCL 1 MG/ML IJ SOLN
0.5000 mg | INTRAMUSCULAR | Status: DC | PRN
  Administered 2016-06-24 – 2016-06-26 (×9): 0.5 mg via INTRAVENOUS
  Filled 2016-06-24 (×8): qty 1

## 2016-06-24 MED ORDER — FENTANYL CITRATE (PF) 100 MCG/2ML IJ SOLN
100.0000 ug | Freq: Once | INTRAMUSCULAR | Status: AC
  Administered 2016-06-24: 100 ug via INTRAVENOUS

## 2016-06-24 MED ORDER — ASPIRIN EC 325 MG PO TBEC
325.0000 mg | DELAYED_RELEASE_TABLET | Freq: Every day | ORAL | Status: DC
  Administered 2016-06-24 – 2016-06-26 (×3): 325 mg via ORAL
  Filled 2016-06-24 (×3): qty 1

## 2016-06-24 MED ORDER — BISACODYL 10 MG RE SUPP: 10.0000 mg | Freq: Every day | RECTAL | Status: DC | PRN

## 2016-06-24 MED ORDER — FENTANYL CITRATE (PF) 100 MCG/2ML IJ SOLN
INTRAMUSCULAR | Status: AC
  Administered 2016-06-24: 50 ug
  Filled 2016-06-24: qty 2

## 2016-06-24 MED ORDER — ONDANSETRON HCL 4 MG PO TABS
4.0000 mg | ORAL_TABLET | Freq: Four times a day (QID) | ORAL | Status: DC | PRN
Start: 2016-06-24 — End: 2016-06-26
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
## 2016-06-24 NOTE — Progress Notes (Signed)
   06/24/16 0515  Clinical Encounter Type  Visited With Patient  Visit Type ED  Spiritual Encounters  Spiritual Needs Emotional  Stress Factors  Patient Stress Factors Health changes  Introduction to Pt. Pt asked for sister and nephew to be contacted. Awaited permission before calling. Nephew's number not set up. Left message with sister.

## 2016-06-24 NOTE — Progress Notes (Signed)
PT Cancellation Note  Patient Details Name: Barry Jordan MRN: [id number] DOB: Aug 01, 1955   Cancelled Treatment:    Reason Eval/Treat Not Completed: Pain limiting ability to participate;Patient declined, no reason specified. Pt reports he is has been at 10/10 since coming to the floor this AM. Requesting PT hold off until tomorrow. Will check back tomorrow.    Colin Broach PT, DPT  [phone]  06/24/2016, 2:24 PM
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
## 2016-06-25 LAB — BASIC METABOLIC PANEL
Anion gap: 9 (ref 5–15)
BUN: 13 mg/dL (ref 6–20)
CO2: 27 mmol/L (ref 22–32)
Calcium: 8.5 mg/dL — ABNORMAL LOW (ref 8.9–10.3)
Chloride: 100 mmol/L — ABNORMAL LOW (ref 101–111)
Creatinine, Ser: 0.9 mg/dL (ref 0.61–1.24)
GFR calc Af Amer: >60 mL/min (ref >60)
GFR calc non Af Amer: >60 mL/min (ref >60)
Glucose, Bld: 106 mg/dL — ABNORMAL HIGH (ref 65–99)
Potassium: 3.9 mmol/L (ref 3.5–5.1)
Sodium: 136 mmol/L (ref 135–145)

## 2016-06-25 LAB — CBC
HCT: 31.2 % — ABNORMAL LOW (ref 39.0–52.0)
Hemoglobin: 10.6 g/dL — ABNORMAL LOW (ref 13.0–17.0)
MCH: 31.2 pg (ref 26.0–34.0)
MCHC: 34 g/dL (ref 30.0–36.0)
MCV: 91.8 fL (ref 78.0–100.0)
Platelets: 154 10*3/uL (ref 150–400)
RBC: 3.4 MIL/uL — ABNORMAL LOW (ref 4.22–5.81)
RDW: 14.5 % (ref 11.5–15.5)
WBC: 8.2 10*3/uL (ref 4.0–10.5)

## 2016-06-25 NOTE — Plan of Care (Signed)
Problem: Pain Managment: Goal: General experience of comfort will improve Outcome: Progressing Pt states his pain is improving. Calls for pain medicine as needed
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
## 2016-06-26 NOTE — Progress Notes (Signed)
Patient signed AMA form. MD paged. Patient made aware of risk of leaving against medical advise but still insist that he has to leave now

## 2016-06-27 ENCOUNTER — Encounter (HOSPITAL_COMMUNITY): Payer: Self-pay | Admitting: Emergency Medicine
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
